# Patient Record
Sex: Female | Born: 1995 | Race: Black or African American | Hispanic: No | Marital: Single | Smoking: Never smoker
Health system: Southern US, Community
[De-identification: ages and names within clinical notes are randomized; demographics above are authoritative.]

## PROBLEM LIST (undated history)

## (undated) HISTORY — PX: BUNIONECTOMY: SHX129

---

## 2016-09-10 ENCOUNTER — Ambulatory Visit (HOSPITAL_COMMUNITY)
Admission: EM | Admit: 2016-09-10 | Discharge: 2016-09-10 | Disposition: A | Discharge status: Home / Self Care | Payer: Medicaid Other | Attending: Internal Medicine | Admitting: Internal Medicine

## 2016-09-10 ENCOUNTER — Encounter (HOSPITAL_COMMUNITY): Payer: Self-pay | Admitting: Emergency Medicine

## 2016-09-10 ENCOUNTER — Ambulatory Visit (INDEPENDENT_AMBULATORY_CARE_PROVIDER_SITE_OTHER): Payer: Medicaid Other

## 2016-09-10 DIAGNOSIS — R69 Illness, unspecified: Secondary | ICD-10-CM

## 2016-09-10 DIAGNOSIS — J40 Bronchitis, not specified as acute or chronic: Secondary | ICD-10-CM | POA: Diagnosis not present

## 2016-09-10 DIAGNOSIS — J111 Influenza due to unidentified influenza virus with other respiratory manifestations: Secondary | ICD-10-CM

## 2016-09-10 MED ORDER — OSELTAMIVIR PHOSPHATE 75 MG PO CAPS: 75.0000 mg | ORAL_CAPSULE | Freq: Two times a day (BID) | ORAL | 0 refills | Status: DC

## 2016-09-10 MED ORDER — ACETAMINOPHEN 325 MG PO TABS
650.0000 mg | ORAL_TABLET | Freq: Once | ORAL | Status: AC
  Administered 2016-09-10: 650 mg via ORAL

## 2016-09-10 MED ORDER — METHYLPREDNISOLONE ACETATE 80 MG/ML IJ SUSP
80.0000 mg | Freq: Once | INTRAMUSCULAR | Status: AC
  Administered 2016-09-10: 80 mg via INTRAMUSCULAR

## 2016-09-10 MED ORDER — BENZONATATE 100 MG PO CAPS: 100.0000 mg | ORAL_CAPSULE | Freq: Three times a day (TID) | ORAL | 0 refills | Status: DC

## 2016-09-10 MED ORDER — ACETAMINOPHEN 325 MG PO TABS
ORAL_TABLET | ORAL | Status: AC
  Filled 2016-09-10: qty 2

## 2016-09-10 MED ORDER — AZITHROMYCIN 250 MG PO TABS: 250.0000 mg | ORAL_TABLET | Freq: Every day | ORAL | 0 refills | Status: DC

## 2016-09-10 MED ORDER — METHYLPREDNISOLONE ACETATE 80 MG/ML IJ SUSP
INTRAMUSCULAR | Status: AC
  Filled 2016-09-10: qty 1

## 2016-09-10 MED ORDER — ALBUTEROL SULFATE HFA 108 (90 BASE) MCG/ACT IN AERS: 1.0000 | INHALATION_SPRAY | Freq: Four times a day (QID) | RESPIRATORY_TRACT | 0 refills | Status: DC | PRN

## 2016-09-10 MED ORDER — PREDNISONE 10 MG (21) PO TBPK: 10.0000 mg | ORAL_TABLET | Freq: Every day | ORAL | 0 refills | Status: DC

## 2016-09-10 MED ORDER — ONDANSETRON 4 MG PO TBDP: 4.0000 mg | ORAL_TABLET | Freq: Three times a day (TID) | ORAL | 0 refills | Status: DC | PRN

## 2016-09-10 NOTE — ED Provider Notes (Signed)
CSN: [id number]     Arrival date & time 09/10/16  1803 History   None    Chief Complaint  Patient presents with  . Cough   (Consider location/radiation/quality/duration/timing/severity/associated sxs/prior Treatment) 21 year old female presents to clinic with 24 hour history of fever muscle aches, body aches and nausea. She also is complaining of cough, ongoing since November, not previously worked up by her provider. Cough is described as dry and hacking, continuous but worse at night. Sputum has been without color. She also reports she has been experiencing shortness of breath, worse with activity that has also been on going for months.   The history is provided by the patient.    History reviewed. No pertinent past medical history. Past Surgical History:  Procedure Laterality Date  . BUNIONECTOMY     No family history on file. Social History  Substance Use Topics  . Smoking status: Never Smoker  . Smokeless tobacco: Never Used  . Alcohol use No   OB History    No data available     Review of Systems  Constitutional: Positive for chills, fatigue and fever.  HENT: Positive for congestion, rhinorrhea and sore throat. Negative for ear discharge, ear pain, sinus pain and sinus pressure.   Eyes: Negative.   Respiratory: Positive for cough, shortness of breath and wheezing. Negative for choking.   Cardiovascular: Negative.   Gastrointestinal: Positive for nausea. Negative for abdominal pain, diarrhea and vomiting.  Musculoskeletal: Positive for myalgias. Negative for arthralgias, back pain, neck pain and neck stiffness.  Skin: Negative.   Neurological: Negative for dizziness, syncope, weakness and light-headedness.  All other systems reviewed and are negative.   Allergies  Patient has no known allergies.  Home Medications   Prior to Admission medications   Medication Sig Start Date End Date Taking? Authorizing Provider  albuterol (PROVENTIL HFA;VENTOLIN HFA) 108 (90  Base) MCG/ACT inhaler Inhale 1-2 puffs into the lungs every 6 (six) hours as needed for wheezing or shortness of breath. 09/10/16   Dorena BodoLawrence Sukhraj Esquivias, NP  azithromycin (ZITHROMAX) 250 MG tablet Take 1 tablet (250 mg total) by mouth daily. Take first 2 tablets together, then 1 every day until finished. 09/10/16   Dorena BodoLawrence Jarris Kortz, NP  benzonatate (TESSALON) 100 MG capsule Take 1 capsule (100 mg total) by mouth every 8 (eight) hours. 09/10/16   Dorena BodoLawrence Joaopedro Eschbach, NP  ondansetron (ZOFRAN ODT) 4 MG disintegrating tablet Take 1 tablet (4 mg total) by mouth every 8 (eight) hours as needed for nausea or vomiting. 09/10/16   Dorena BodoLawrence [place] Lasseter, NP  oseltamivir (TAMIFLU) 75 MG capsule Take 1 capsule (75 mg total) by mouth every 12 (twelve) hours. 09/10/16   Dorena BodoLawrence Anadia Helmes, NP  predniSONE (STERAPRED UNI-PAK 21 TAB) 10 MG (21) TBPK tablet Take 1 tablet (10 mg total) by mouth daily. Take 6 tablets in the morning, decrease by 1 each day till finished 09/10/16   Dorena BodoLawrence Blade Scheff, NP   Meds Ordered and Administered this Visit   Medications  acetaminophen (TYLENOL) tablet 650 mg (650 mg Oral Given 09/10/16 1852)  methylPREDNISolone acetate (DEPO-MEDROL) injection 80 mg (80 mg Intramuscular Given 09/10/16 1930)    BP 130/78 (BP Location: Right Arm)   Pulse (!) 124   Temp 102.9 F (39.4 C) (Oral)   Resp 18   SpO2 98%  No data found.   Physical Exam  Constitutional: She is oriented to person, place, and time. She appears well-developed and well-nourished. She has a sickly appearance. She appears ill. No distress.  HENT:  Head: Normocephalic and atraumatic.  Right Ear: Tympanic membrane and external ear normal.  Left Ear: Tympanic membrane and external ear normal.  Nose: Rhinorrhea present.  Mouth/Throat: Uvula is midline and oropharynx is clear and moist.  Neck: Normal range of motion. Neck supple. No JVD present.  Cardiovascular: Normal rate and regular rhythm.   Pulmonary/Chest: Effort normal and breath sounds  normal.  Abdominal: Soft. Bowel sounds are normal.  Lymphadenopathy:    She has no cervical adenopathy.  Neurological: She is alert and oriented to person, place, and time.  Skin: Skin is warm and dry. Capillary refill takes less than 2 seconds. She is not diaphoretic.  Nursing note and vitals reviewed.   Urgent Care Course     Procedures (including critical care time)  Labs Review Labs Reviewed - No data to display  Imaging Review Dg Chest 2 View  Result Date: 09/10/2016 CLINICAL DATA:  Chronic cough and fever. Sore throat and body aches. Initial encounter. EXAM: CHEST  2 VIEW COMPARISON:  None. FINDINGS: The lungs are well-aerated and clear. There is no evidence of focal opacification, pleural effusion or pneumothorax. The heart is normal in size; the mediastinal contour is within normal limits. No acute osseous abnormalities are seen. IMPRESSION: No acute cardiopulmonary process seen. Electronically Signed   By: Roanna Raider M.D.   On: 09/10/2016 19:39     Visual Acuity Review  Right Eye Distance:   Left Eye Distance:   Bilateral Distance:    Right Eye Near:   Left Eye Near:    Bilateral Near:         MDM   1. Bronchitis   2. Influenza-like illness   I am treating you tonight for two different conditions. I am treating your cough for bronchitis. I have prescribed azithromycin, take 2 tablets tonight, then 1 daily till finished. I have also prescribed a prednisone taper, take 6 tablets in the morning, decrease by 1 each day till finished (6,5,4,3,2,1), and I have prescribed an albuterol inhaler, take 1-2 puffs ever 4-6 hours as needed for shortness of breath or wheezing.  The second condition I am treating you for tonight is influenza. I have prescribed Tamiflu, take 1 tablet twice a day for 5 days. I have given a medicine for nausea, take 1 tablet under the tongue every 8 hours as needed for nausea, and Tessalon Pearls, 1 tablet every 8 hours as needed for  cough.  For symptoms, take tylenol ever 4 hours as needed for fever, not to exceed 4000 mg a day, or ibuprofen every 6 hours, not to exceed 1600 mg in a day. You may do mucinex twice a day with a full glass of water and for congestion a pseudoephedrine containing product. Should your symptoms fail to resolve or worsen follow up with your primary care provider or return to clinic.      Dorena Bodo, NP 09/10/16 1956

## 2016-09-10 NOTE — ED Triage Notes (Signed)
Onset Monday of symptoms, frequently coughing, unknown if she had a fever.  Patient feels weak and very tired

## 2016-09-10 NOTE — Discharge Instructions (Signed)
I am treating you tonight for two different conditions. I am treating your cough for bronchitis. I have prescribed azithromycin, take 2 tablets tonight, then 1 daily till finished. I have also prescribed a prednisone taper, take 6 tablets in the morning, decrease by 1 each day till finished (6,5,4,3,2,1), and I have prescribed an albuterol inhaler, take 1-2 puffs ever 4-6 hours as needed for shortness of breath or wheezing.  The second condition I am treating you for tonight is influenza. I have prescribed Tamiflu, take 1 tablet twice a day for 5 days. I have given a medicine for nausea, take 1 tablet under the tongue every 8 hours as needed for nausea, and Tessalon Pearls, 1 tablet every 8 hours as needed for cough.  For symptoms, take tylenol ever 4 hours as needed for fever, not to exceed 4000 mg a day, or ibuprofen every 6 hours, not to exceed 1600 mg in a day. You may do mucinex twice a day with a full glass of water and for congestion a pseudoephedrine containing product. Should your symptoms fail to resolve or worsen follow up with your primary care provider or return to clinic.

## 2016-09-29 ENCOUNTER — Encounter (HOSPITAL_COMMUNITY): Payer: Self-pay | Admitting: Emergency Medicine

## 2016-09-29 ENCOUNTER — Ambulatory Visit (HOSPITAL_COMMUNITY)
Admission: EM | Admit: 2016-09-29 | Discharge: 2016-09-29 | Disposition: A | Discharge status: Home / Self Care | Payer: Medicaid Other | Attending: Family Medicine | Admitting: Family Medicine

## 2016-09-29 DIAGNOSIS — B9789 Other viral agents as the cause of diseases classified elsewhere: Secondary | ICD-10-CM | POA: Diagnosis not present

## 2016-09-29 DIAGNOSIS — J069 Acute upper respiratory infection, unspecified: Secondary | ICD-10-CM | POA: Diagnosis not present

## 2016-09-29 MED ORDER — GUAIFENESIN-CODEINE 100-10 MG/5ML PO SYRP: 10.0000 mL | ORAL_SOLUTION | Freq: Four times a day (QID) | ORAL | 0 refills | Status: DC | PRN

## 2016-09-29 MED ORDER — AZITHROMYCIN 250 MG PO TABS: ORAL_TABLET | ORAL | 0 refills | Status: DC

## 2016-09-29 MED ORDER — IPRATROPIUM BROMIDE 0.06 % NA SOLN: 2.0000 | Freq: Four times a day (QID) | NASAL | 1 refills | Status: DC

## 2016-09-29 NOTE — ED Triage Notes (Signed)
Pt reports cough, nasal congestion, head pressure, dizziness and chills since Friday.

## 2016-09-29 NOTE — ED Provider Notes (Signed)
[facility]    CSN: [id number] Arrival date & time: 09/29/16  1021     History   Chief Complaint Chief Complaint  Patient presents with  . URI    HPI Veronica Powell is a 21 y.o. female.   The history is provided by the patient.  URI  Presenting symptoms: congestion, cough, rhinorrhea and sore throat   Presenting symptoms: no fever   Severity:  Moderate Onset quality:  Gradual (seen 1/24 fdr similar sx, improved briefly then relapsed1 wk.) Duration:  1 week Progression:  Worsening Chronicity:  Recurrent Relieved by:  Nothing Associated symptoms: no wheezing     History reviewed. No pertinent past medical history.  There are no active problems to display for this patient.   Past Surgical History:  Procedure Laterality Date  . BUNIONECTOMY      OB History    No data available       Home Medications    Prior to Admission medications   Medication Sig Start Date End Date Taking? Authorizing Provider  albuterol (PROVENTIL HFA;VENTOLIN HFA) 108 (90 Base) MCG/ACT inhaler Inhale 1-2 puffs into the lungs every 6 (six) hours as needed for wheezing or shortness of breath. 09/10/16  Yes Dorena BodoLawrence Kennard, NP  azithromycin (ZITHROMAX) 250 MG tablet Take 1 tablet (250 mg total) by mouth daily. Take first 2 tablets together, then 1 every day until finished. 09/10/16   Dorena BodoLawrence Kennard, NP  benzonatate (TESSALON) 100 MG capsule Take 1 capsule (100 mg total) by mouth every 8 (eight) hours. 09/10/16   Dorena BodoLawrence Kennard, NP  ondansetron (ZOFRAN ODT) 4 MG disintegrating tablet Take 1 tablet (4 mg total) by mouth every 8 (eight) hours as needed for nausea or vomiting. 09/10/16   Dorena BodoLawrence Kennard, NP  oseltamivir (TAMIFLU) 75 MG capsule Take 1 capsule (75 mg total) by mouth every 12 (twelve) hours. 09/10/16   Dorena BodoLawrence Kennard, NP  predniSONE (STERAPRED UNI-PAK 21 TAB) 10 MG (21) TBPK tablet Take 1 tablet (10 mg total) by mouth daily. Take 6 tablets in the morning, decrease by 1  each day till finished 09/10/16   Dorena BodoLawrence Kennard, NP    Family History History reviewed. No pertinent family history.  Social History Social History  Substance Use Topics  . Smoking status: Never Smoker  . Smokeless tobacco: Never Used  . Alcohol use No     Allergies   Patient has no known allergies.   Review of Systems Review of Systems  Constitutional: Negative.  Negative for activity change, appetite change and fever.  HENT: Positive for congestion, rhinorrhea and sore throat.   Respiratory: Positive for cough. Negative for shortness of breath and wheezing.   All other systems reviewed and are negative.    Physical Exam Triage Vital Signs ED Triage Vitals [09/29/16 1128]  Enc Vitals Group     BP 111/61     Pulse Rate 100     Resp      Temp 99.1 F (37.3 C)     Temp Source Oral     SpO2 100 %     Weight      Height      Head Circumference      Peak Flow      Pain Score 8     Pain Loc      Pain Edu?      Excl. in GC?    No data found.   Updated Vital Signs BP 111/61 (BP Location: Right Arm)   Pulse 100  Temp 99.1 F (37.3 C) (Oral)   SpO2 100%   Visual Acuity Right Eye Distance:   Left Eye Distance:   Bilateral Distance:    Right Eye Near:   Left Eye Near:    Bilateral Near:     Physical Exam  Constitutional: She appears well-developed and well-nourished.  HENT:  Right Ear: External ear normal.  Left Ear: External ear normal.  Nose: Rhinorrhea present.  Mouth/Throat: Oropharynx is clear and moist.  Eyes: EOM are normal. Pupils are equal, round, and reactive to light.  Neck: Normal range of motion. Neck supple.  Cardiovascular: Normal rate, regular rhythm, normal heart sounds and intact distal pulses.   Pulmonary/Chest: Effort normal and breath sounds normal.  Abdominal: Soft. Bowel sounds are normal.  Lymphadenopathy:    She has no cervical adenopathy.  Skin: Skin is warm and dry.  Nursing note and vitals reviewed.    UC  Treatments / Results  Labs (all labs ordered are listed, but only abnormal results are displayed) Labs Reviewed - No data to display  EKG  EKG Interpretation None       Radiology No results found.  Procedures Procedures (including critical care time)  Medications Ordered in UC Medications - No data to display   Initial Impression / Assessment and Plan / UC Course  I have reviewed the triage vital signs and the nursing notes.  Pertinent labs & imaging results that were available during my care of the patient were reviewed by me and considered in my medical decision making (see chart for details).       Final Clinical Impressions(s) / UC Diagnoses   Final diagnoses:  None    New Prescriptions New Prescriptions   No medications on file     Linna Hoff, MD 09/29/16 1228

## 2017-04-06 ENCOUNTER — Ambulatory Visit (HOSPITAL_COMMUNITY)
Admission: EM | Admit: 2017-04-06 | Discharge: 2017-04-06 | Disposition: A | Discharge status: Home / Self Care | Payer: Medicaid Other | Attending: Family Medicine | Admitting: Family Medicine

## 2017-04-06 ENCOUNTER — Encounter (HOSPITAL_COMMUNITY): Payer: Self-pay | Admitting: Emergency Medicine

## 2017-04-06 DIAGNOSIS — B9789 Other viral agents as the cause of diseases classified elsewhere: Secondary | ICD-10-CM | POA: Diagnosis not present

## 2017-04-06 DIAGNOSIS — J069 Acute upper respiratory infection, unspecified: Secondary | ICD-10-CM

## 2017-04-06 MED ORDER — IPRATROPIUM BROMIDE 0.06 % NA SOLN: 2.0000 | Freq: Four times a day (QID) | NASAL | 12 refills | Status: AC

## 2017-04-06 MED ORDER — BENZONATATE 100 MG PO CAPS: 100.0000 mg | ORAL_CAPSULE | Freq: Three times a day (TID) | ORAL | 0 refills | Status: DC

## 2017-04-06 MED ORDER — CETIRIZINE-PSEUDOEPHEDRINE ER 5-120 MG PO TB12: 1.0000 | ORAL_TABLET | Freq: Every day | ORAL | 0 refills | Status: AC

## 2017-04-06 NOTE — ED Provider Notes (Signed)
[facility]    CSN: [id number] Arrival date & time: 04/06/17  1416     History   Chief Complaint Chief Complaint  Patient presents with  . URI    HPI Veronica Powell is a 21 y.o. female.   21 year old female comes in for 3-4 day history of nasal congestion, cough, throat irritation, ear fullness. She has been using Flonase and over-the-counter cough medicine with some relief. Had chills last night. Denies fever, night sweats. States has trouble breathing, shortness of breath, but could be due to stuffy nose. Denies abdominal pain, nausea, vomiting, diarrhea, constipation. Denies wheezing, history of asthma.       History reviewed. No pertinent past medical history.  There are no active problems to display for this patient.   Past Surgical History:  Procedure Laterality Date  . BUNIONECTOMY      OB History    No data available       Home Medications    Prior to Admission medications   Medication Sig Start Date End Date Taking? Authorizing Provider  benzonatate (TESSALON) 100 MG capsule Take 1 capsule (100 mg total) by mouth every 8 (eight) hours. 04/06/17   Cathie Hoops, Shanna Un V, PA-C  cetirizine-pseudoephedrine (ZYRTEC-D) 5-120 MG tablet Take 1 tablet by mouth daily. 04/06/17   Cathie Hoops, Avelino Herren V, PA-C  ipratropium (ATROVENT) 0.06 % nasal spray Place 2 sprays into both nostrils 4 (four) times daily. 04/06/17   Belinda Fisher, PA-C    Family History History reviewed. No pertinent family history.  Social History Social History  Substance Use Topics  . Smoking status: Never Smoker  . Smokeless tobacco: Never Used  . Alcohol use No     Allergies   Patient has no known allergies.   Review of Systems Review of Systems  Reason unable to perform ROS: See HPI as above.     Physical Exam Triage Vital Signs ED Triage Vitals [04/06/17 1443]  Enc Vitals Group     BP 98/70     Pulse Rate 93     Resp      Temp 98.5 F (36.9 C)     Temp Source Oral     SpO2 99 %   Weight      Height      Head Circumference      Peak Flow      Pain Score      Pain Loc      Pain Edu?      Excl. in GC?    No data found.   Updated Vital Signs BP 98/70 (BP Location: Left Arm)   Pulse 93   Temp 98.5 F (36.9 C) (Oral)   SpO2 99%   Visual Acuity Right Eye Distance:   Left Eye Distance:   Bilateral Distance:    Right Eye Near:   Left Eye Near:    Bilateral Near:     Physical Exam  Constitutional: She is oriented to person, place, and time. She appears well-developed and well-nourished. No distress.  HENT:  Head: Normocephalic and atraumatic.  Right Ear: External ear and ear canal normal. Tympanic membrane is erythematous. Tympanic membrane is not bulging. A middle ear effusion is present.  Left Ear: External ear and ear canal normal. Tympanic membrane is not erythematous and not bulging. A middle ear effusion is present.  Nose: Mucosal edema and rhinorrhea present. Right sinus exhibits no maxillary sinus tenderness and no frontal sinus tenderness. Left sinus exhibits no maxillary sinus tenderness and no  frontal sinus tenderness.  Mouth/Throat: Uvula is midline and mucous membranes are normal. Posterior oropharyngeal erythema present. No posterior oropharyngeal edema.  Eyes: Pupils are equal, round, and reactive to light. Conjunctivae are normal.  Neck: Normal range of motion. Neck supple.  Cardiovascular: Normal rate, regular rhythm and normal heart sounds.  Exam reveals no gallop and no friction rub.   No murmur heard. Pulmonary/Chest: Effort normal and breath sounds normal. She has no decreased breath sounds. She has no wheezes. She has no rhonchi. She has no rales.  Lymphadenopathy:    She has no cervical adenopathy.  Neurological: She is alert and oriented to person, place, and time.  Skin: Skin is warm and dry.  Psychiatric: She has a normal mood and affect. Her behavior is normal. Judgment normal.     UC Treatments / Results  Labs (all labs  ordered are listed, but only abnormal results are displayed) Labs Reviewed - No data to display  EKG  EKG Interpretation None       Radiology No results found.  Procedures Procedures (including critical care time)  Medications Ordered in UC Medications - No data to display   Initial Impression / Assessment and Plan / UC Course  I have reviewed the triage vital signs and the nursing notes.  Pertinent labs & imaging results that were available during my care of the patient were reviewed by me and considered in my medical decision making (see chart for details).    Discussed with patient history and exam most consistent with viral URI. Erythema on the tympanic membrane most likely due to mid ear effusion and coughing. Symptomatic treatment. Push fluids. Return precautions given.  Final Clinical Impressions(s) / UC Diagnoses   Final diagnoses:  Viral URI with cough    New Prescriptions New Prescriptions   BENZONATATE (TESSALON) 100 MG CAPSULE    Take 1 capsule (100 mg total) by mouth every 8 (eight) hours.   CETIRIZINE-PSEUDOEPHEDRINE (ZYRTEC-D) 5-120 MG TABLET    Take 1 tablet by mouth daily.   IPRATROPIUM (ATROVENT) 0.06 % NASAL SPRAY    Place 2 sprays into both nostrils 4 (four) times daily.     Belinda Fisher, PA-C 04/06/17 1523

## 2017-04-06 NOTE — ED Triage Notes (Signed)
Pt complains of nasal congestion and drainage, a scratchy throat, cough and fullness in her head and ears.

## 2017-04-06 NOTE — Discharge Instructions (Signed)
Tessalon for cough. Zyrtec-D and Atrovent nasal spray for nasal congestion. You can also use over-the-counter nasal rinse such as neti pot for nasal congestion. Keep hydrated, your urine should be clear to pale yellow in color. Monitor for worsening of symptoms, right ear pain not resolving, fever, shortness of breath, trouble breathing, follow-up for reevaluation.

## 2017-05-20 ENCOUNTER — Encounter (HOSPITAL_COMMUNITY): Payer: Self-pay | Admitting: Emergency Medicine

## 2017-05-20 ENCOUNTER — Emergency Department (HOSPITAL_COMMUNITY): Payer: Medicaid Other

## 2017-05-20 DIAGNOSIS — R0602 Shortness of breath: Secondary | ICD-10-CM | POA: Insufficient documentation

## 2017-05-20 DIAGNOSIS — Z79899 Other long term (current) drug therapy: Secondary | ICD-10-CM | POA: Diagnosis not present

## 2017-05-20 DIAGNOSIS — J208 Acute bronchitis due to other specified organisms: Secondary | ICD-10-CM | POA: Insufficient documentation

## 2017-05-20 DIAGNOSIS — R05 Cough: Secondary | ICD-10-CM | POA: Diagnosis present

## 2017-05-20 LAB — CBC WITH DIFFERENTIAL/PLATELET
BASOS ABS: 0 10*3/uL (ref 0.0–0.1)
BASOS PCT: 0 %
EOS ABS: 0.1 10*3/uL (ref 0.0–0.7)
Eosinophils Relative: 1 %
HCT: 34.3 % — ABNORMAL LOW (ref 36.0–46.0)
HEMOGLOBIN: 11.3 g/dL — AB (ref 12.0–15.0)
Lymphocytes Relative: 33 %
Lymphs Abs: 2.2 10*3/uL (ref 0.7–4.0)
MCH: 27.7 pg (ref 26.0–34.0)
MCHC: 32.9 g/dL (ref 30.0–36.0)
MCV: 84.1 fL (ref 78.0–100.0)
MONOS PCT: 5 %
Monocytes Absolute: 0.3 10*3/uL (ref 0.1–1.0)
NEUTROS ABS: 4 10*3/uL (ref 1.7–7.7)
NEUTROS PCT: 61 %
Platelets: 237 10*3/uL (ref 150–400)
RBC: 4.08 MIL/uL (ref 3.87–5.11)
RDW: 13.5 % (ref 11.5–15.5)
WBC: 6.6 10*3/uL (ref 4.0–10.5)

## 2017-05-20 LAB — I-STAT BETA HCG BLOOD, ED (MC, WL, AP ONLY)

## 2017-05-20 LAB — BASIC METABOLIC PANEL
ANION GAP: 8 (ref 5–15)
BUN: 8 mg/dL (ref 6–20)
CHLORIDE: 103 mmol/L (ref 101–111)
CO2: 25 mmol/L (ref 22–32)
Calcium: 9 mg/dL (ref 8.9–10.3)
Creatinine, Ser: 0.71 mg/dL (ref 0.44–1.00)
GFR calc non Af Amer: >60 mL/min (ref >60)
Glucose, Bld: 84 mg/dL (ref 65–99)
POTASSIUM: 3.9 mmol/L (ref 3.5–5.1)
SODIUM: 136 mmol/L (ref 135–145)

## 2017-05-20 NOTE — ED Triage Notes (Signed)
Patient reports persistent dry cough with mild SOB for several weeks , denies fever or chills .

## 2017-05-21 ENCOUNTER — Emergency Department (HOSPITAL_COMMUNITY)
Admission: EM | Admit: 2017-05-21 | Discharge: 2017-05-21 | Disposition: A | Discharge status: Home / Self Care | Payer: Medicaid Other | Attending: Emergency Medicine | Admitting: Emergency Medicine

## 2017-05-21 DIAGNOSIS — J208 Acute bronchitis due to other specified organisms: Secondary | ICD-10-CM

## 2017-05-21 MED ORDER — ALBUTEROL SULFATE HFA 108 (90 BASE) MCG/ACT IN AERS: 1.0000 | INHALATION_SPRAY | Freq: Four times a day (QID) | RESPIRATORY_TRACT | 0 refills | Status: AC | PRN

## 2017-05-21 MED ORDER — ALBUTEROL SULFATE HFA 108 (90 BASE) MCG/ACT IN AERS
2.0000 | INHALATION_SPRAY | Freq: Once | RESPIRATORY_TRACT | Status: AC
  Administered 2017-05-21: 2 via RESPIRATORY_TRACT

## 2017-05-21 MED ORDER — ALBUTEROL SULFATE HFA 108 (90 BASE) MCG/ACT IN AERS
INHALATION_SPRAY | RESPIRATORY_TRACT | Status: AC
  Filled 2017-05-21: qty 6.7

## 2017-05-21 MED ORDER — DEXAMETHASONE 4 MG PO TABS
8.0000 mg | ORAL_TABLET | Freq: Once | ORAL | Status: AC
  Administered 2017-05-21: 8 mg via ORAL

## 2017-05-21 MED ORDER — DEXAMETHASONE 4 MG PO TABS
ORAL_TABLET | ORAL | Status: AC
  Filled 2017-05-21: qty 2

## 2017-05-21 MED ORDER — AMOXICILLIN 500 MG PO CAPS: 500.0000 mg | ORAL_CAPSULE | Freq: Three times a day (TID) | ORAL | 0 refills | Status: AC

## 2017-05-21 NOTE — ED Provider Notes (Signed)
[facility]   CSN: [id number] Arrival date & time: 05/20/17  2055     History   Chief Complaint Chief Complaint  Patient presents with  . Cough    HPI Veronica Powell is a 21 y.o. female.  The history is provided by the patient.  Cough  This is a new problem. The current episode started more than 1 week ago. The problem occurs every few minutes. The problem has been gradually worsening. The cough is non-productive. There has been no fever. Associated symptoms include chest pain, chills and shortness of breath. She is not a smoker.  Pt is without chronic medical conditions presents with cough for over a month No hemoptysis She has chest wall pain due to cough She feels short of breath due to cough She was seen at urgent care in August for same cough but it is persistent No h/o asthma   PMH - none Soc hx - nonsmoker, no travel Past Surgical History:  Procedure Laterality Date  . BUNIONECTOMY      OB History    No data available       Home Medications    Prior to Admission medications   Medication Sig Start Date End Date Taking? Authorizing Provider  albuterol (PROVENTIL HFA;VENTOLIN HFA) 108 (90 Base) MCG/ACT inhaler Inhale 1-2 puffs into the lungs every 6 (six) hours as needed for wheezing or shortness of breath. 05/21/17   Zadie Rhine, MD  amoxicillin (AMOXIL) 500 MG capsule Take 1 capsule (500 mg total) by mouth 3 (three) times daily. 05/21/17   Zadie Rhine, MD  cetirizine-pseudoephedrine (ZYRTEC-D) 5-120 MG tablet Take 1 tablet by mouth daily. 04/06/17   Cathie Hoops, Amy V, PA-C  ipratropium (ATROVENT) 0.06 % nasal spray Place 2 sprays into both nostrils 4 (four) times daily. 04/06/17   Belinda Fisher, PA-C    Family History No family history on file.  Social History Social History  Substance Use Topics  . Smoking status: Never Smoker  . Smokeless tobacco: Never Used  . Alcohol use No     Allergies   Patient has no known  allergies.   Review of Systems Review of Systems  Constitutional: Positive for chills.  Respiratory: Positive for cough and shortness of breath.   Cardiovascular: Positive for chest pain.  Gastrointestinal: Negative for vomiting.  All other systems reviewed and are negative.    Physical Exam Updated Vital Signs BP 120/66 (BP Location: Right Arm)   Pulse 78   Temp 98.3 F (36.8 C) (Oral)   Resp 16   SpO2 100%   Physical Exam CONSTITUTIONAL: Well developed/well nourished HEAD: Normocephalic/atraumatic EYES: EOMI/PERRL ENMT: Mucous membranes moist, uvula midline, no erythema/exudates NECK: supple no meningeal signs SPINE/BACK:entire spine nontender CV: S1/S2 noted, no murmurs/rubs/gallops noted LUNGS: decreased BS noted in the bases.  Scattered wheeze.  No crackles.  Coughs frequently during exam ABDOMEN: soft, nontender  GU:no cva tenderness NEURO: Pt is awake/alert/appropriate, moves all extremitiesx4.  No facial droop.   EXTREMITIES: pulses normal/equal, full ROM, no LE edema, no  Calf tenderness SKIN: warm, color normal PSYCH: no abnormalities of mood noted, alert and oriented to situation   ED Treatments / Results  Labs (all labs ordered are listed, but only abnormal results are displayed) Labs Reviewed  CBC WITH DIFFERENTIAL/PLATELET - Abnormal; Notable for the following:       Result Value   Hemoglobin 11.3 (*)    HCT 34.3 (*)    All other components within normal limits  BASIC METABOLIC PANEL  I-STAT BETA HCG BLOOD, ED ([facility], [facility], [facility] ONLY)    EKG  EKG Interpretation  Date/Time:  Wednesday May 20 2017 20:59:10 EDT Ventricular Rate:  91 PR Interval:  144 QRS Duration: 70 QT Interval:  330 QTC Calculation: 405 R Axis:   70 Text Interpretation:  Normal sinus rhythm Normal ECG No previous ECGs available Confirmed by Zadie Rhine ([id number]) on 05/21/2017 1:51:50 AM       Radiology Dg Chest 2 View  Result Date: 05/20/2017 CLINICAL DATA:  Dry cough  and shortness-of-breath 2 weeks. EXAM: CHEST  2 VIEW COMPARISON:  09/10/2016 FINDINGS: Lungs are adequately inflated without focal airspace consolidation or effusion. Cardiomediastinal silhouette is within normal. Bones and soft tissues are unremarkable. IMPRESSION: No active cardiopulmonary disease. Electronically Signed   By: Elberta Fortis M.D.   On: 05/20/2017 21:51    Procedures Procedures (including critical care time)  Medications Ordered in ED Medications  albuterol (PROVENTIL HFA;VENTOLIN HFA) 108 (90 Base) MCG/ACT inhaler 2 puff (2 puffs Inhalation Given 05/21/17 0214)  dexamethasone (DECADRON) tablet 8 mg (8 mg Oral Given 05/21/17 0214)     Initial Impression / Assessment and Plan / ED Course  I have reviewed the triage vital signs and the nursing notes.  Pertinent labs & imaging results that were available during my care of the patient were reviewed by me and considered in my medical decision making (see chart for details).     Pt stable No distress, smiling She has continued cough for a month Due to persistent cough will give antibiotic She did have scattered wheeze, will treat with albuterol/decadron We discussed strict ER return precautions   Final Clinical Impressions(s) / ED Diagnoses   Final diagnoses:  Acute bronchitis due to other specified organisms    New Prescriptions New Prescriptions   ALBUTEROL (PROVENTIL HFA;VENTOLIN HFA) 108 (90 BASE) MCG/ACT INHALER    Inhale 1-2 puffs into the lungs every 6 (six) hours as needed for wheezing or shortness of breath.   AMOXICILLIN (AMOXIL) 500 MG CAPSULE    Take 1 capsule (500 mg total) by mouth 3 (three) times daily.     Zadie Rhine, MD 05/21/17 Emeline Darling

## 2017-10-01 ENCOUNTER — Encounter (HOSPITAL_COMMUNITY): Payer: Self-pay | Admitting: Emergency Medicine

## 2017-10-01 ENCOUNTER — Ambulatory Visit (HOSPITAL_COMMUNITY)
Admission: EM | Admit: 2017-10-01 | Discharge: 2017-10-01 | Disposition: A | Discharge status: Home / Self Care | Payer: Medicaid Other | Attending: Internal Medicine | Admitting: Internal Medicine

## 2017-10-01 ENCOUNTER — Other Ambulatory Visit: Payer: Self-pay

## 2017-10-01 DIAGNOSIS — K5900 Constipation, unspecified: Secondary | ICD-10-CM | POA: Diagnosis not present

## 2017-10-01 NOTE — Discharge Instructions (Signed)
Please use Miralax for moderate to severe constipation. Take this once a day for the next 2-3 days. Please also start docusate stool softener, twice a day for at least 1 week. If stools become loose, cut down to once a day for another week. If stools remain loose, cut back to 1 pill every other day for a third week. You can stop docusate thereafter and resume as needed for constipation. You may titrate the amount of Miralax and frequency of docusate to have more regular bowels.   If you wish to do a clean out try taking and capful of Miralax up to three times spaced 1 hour apart. If bowels beginning to move after first or second capful you do not need to repeat the third time.   To help reduce constipation and promote bowel health: 1. Drink at least 64 ounces of water each day 2. Eat plenty of fiber (fruits, vegetables, whole grains, legumes) 3. Be physically active or exercise including walking, jogging, swimming, yoga, etc. 4. For active constipation use a stool softener (docusate) or an osmotic laxative (like Miralax) each day, or as needed.

## 2017-10-01 NOTE — ED Triage Notes (Signed)
Pt c/o generalized abdominal pain x3 days. Denies n/v/d. Comes and goes. Denies issues with urination. Nontender to palpation.

## 2017-10-02 NOTE — ED Provider Notes (Signed)
[facility]    CSN: [id number] Arrival date & time: 10/01/17  1257     History   Chief Complaint Chief Complaint  Patient presents with  . Abdominal Pain  . Constipation    HPI Veronica Powell is a 22 y.o. female history of lactose intolerance presenting today with intermittent abdominal pain and states that she struggles with constipation.  She has not had a bowel movement in 5 days.  She has taken 1 capful of MiraLAX for the past 2 days.  Abdominal pain has come on for approximately the past 3 days.  She tries to avoid dairy products.  HPI  History reviewed. No pertinent past medical history.  There are no active problems to display for this patient.   Past Surgical History:  Procedure Laterality Date  . BUNIONECTOMY      OB History    No data available       Home Medications    Prior to Admission medications   Medication Sig Start Date End Date Taking? Authorizing Provider  albuterol (PROVENTIL HFA;VENTOLIN HFA) 108 (90 Base) MCG/ACT inhaler Inhale 1-2 puffs into the lungs every 6 (six) hours as needed for wheezing or shortness of breath. 05/21/17   Zadie Rhine, MD  amoxicillin (AMOXIL) 500 MG capsule Take 1 capsule (500 mg total) by mouth 3 (three) times daily. Patient not taking: Reported on 10/01/2017 05/21/17   Zadie Rhine, MD  cetirizine-pseudoephedrine (ZYRTEC-D) 5-120 MG tablet Take 1 tablet by mouth daily. Patient not taking: Reported on 10/01/2017 04/06/17   Belinda Fisher, PA-C  ipratropium (ATROVENT) 0.06 % nasal spray Place 2 sprays into both nostrils 4 (four) times daily. Patient not taking: Reported on 10/01/2017 04/06/17   Lurline Idol    Family History No family history on file.  Social History Social History   Tobacco Use  . Smoking status: Never Smoker  . Smokeless tobacco: Never Used  Substance Use Topics  . Alcohol use: No  . Drug use: No     Allergies   Lactose intolerance (gi)   Review of Systems Review of  Systems  Constitutional: Negative for activity change, appetite change, fatigue and fever.  Respiratory: Negative for shortness of breath.   Cardiovascular: Negative for chest pain.  Gastrointestinal: Positive for abdominal pain and constipation. Negative for blood in stool, diarrhea, nausea and vomiting.  Genitourinary: Negative for dysuria, frequency and vaginal discharge.  Neurological: Negative for dizziness, weakness and light-headedness.     Physical Exam Triage Vital Signs ED Triage Vitals  Enc Vitals Group     BP 10/01/17 1427 121/67     Pulse Rate 10/01/17 1427 85     Resp 10/01/17 1427 18     Temp 10/01/17 1427 98.8 F (37.1 C)     Temp src --      SpO2 10/01/17 1427 100 %     Weight --      Height --      Head Circumference --      Peak Flow --      Pain Score 10/01/17 1428 5     Pain Loc --      Pain Edu? --      Excl. in GC? --    No data found.  Updated Vital Signs BP 121/67   Pulse 85   Temp 98.8 F (37.1 C)   Resp 18   SpO2 100%   Visual Acuity Right Eye Distance:   Left Eye Distance:   Bilateral  Distance:    Right Eye Near:   Left Eye Near:    Bilateral Near:     Physical Exam  Constitutional: She appears well-developed and well-nourished. No distress.  HENT:  Head: Normocephalic and atraumatic.  Mouth/Throat: Oropharynx is clear and moist.  Eyes: Conjunctivae are normal. Pupils are equal, round, and reactive to light.  Neck: Neck supple.  Cardiovascular: Normal rate and regular rhythm.  No murmur heard. Pulmonary/Chest: Effort normal and breath sounds normal. No respiratory distress.  Abdominal: Soft. There is no tenderness.  Abdomen slightly firm, dull to percussion throughout, nontender to light and deep palpation in all 4 quadrants.  Musculoskeletal: She exhibits no edema.  Neurological: She is alert.  Skin: Skin is warm and dry.  Psychiatric: She has a normal mood and affect.  Nursing note and vitals reviewed.    UC Treatments  / Results  Labs (all labs ordered are listed, but only abnormal results are displayed) Labs Reviewed - No data to display  EKG  EKG Interpretation None       Radiology No results found.  Procedures Procedures (including critical care time)  Medications Ordered in UC Medications - No data to display   Initial Impression / Assessment and Plan / UC Course  I have reviewed the triage vital signs and the nursing notes.  Pertinent labs & imaging results that were available during my care of the patient were reviewed by me and considered in my medical decision making (see chart for details).     Patient with constipation, recommended to do either a cleanout using up to 3 capfuls of MiraLAX space out of her 1 hour each.  Or to add an docusate 8 twice daily with daily MiraLAX.  Titrate docusate and MiraLAX to regular bowels/consistency.  Advised to daily regimen is too much to take MiraLAX after she has not had a bowel movement in 2 days instead of waiting until 5.  Discussed other general lifestyle modifications to help with constipation.  Discussed strict return precautions. Patient verbalized understanding and is agreeable with plan.   Final Clinical Impressions(s) / UC Diagnoses   Final diagnoses:  Constipation, unspecified constipation type    ED Discharge Orders    None       Controlled Substance Prescriptions [place] Controlled Substance Registry consulted? Not Applicable   Lew DawesWieters, Gail Vendetti C, [place]PA-C 10/02/17 [phone]

## 2017-11-11 ENCOUNTER — Other Ambulatory Visit: Payer: Self-pay

## 2017-11-11 ENCOUNTER — Ambulatory Visit (HOSPITAL_COMMUNITY)
Admission: EM | Admit: 2017-11-11 | Discharge: 2017-11-11 | Disposition: A | Discharge status: Home / Self Care | Payer: Medicaid Other | Attending: Family Medicine | Admitting: Family Medicine

## 2017-11-11 ENCOUNTER — Encounter (HOSPITAL_COMMUNITY): Payer: Self-pay | Admitting: Emergency Medicine

## 2017-11-11 DIAGNOSIS — R195 Other fecal abnormalities: Secondary | ICD-10-CM

## 2017-11-11 NOTE — ED Triage Notes (Signed)
Patient reports taking a lot of miralax this morning.  Patient then went to work.  Patient reports frequent soft/watery stools.  Employer wants patient to be evaluated and wants a note saying she is ok to work

## 2017-11-18 NOTE — ED Provider Notes (Signed)
[facility]   [id number] 11/11/17 Arrival Time: 1731  ASSESSMENT & PLAN:  1. Loose stools    Symptoms improving rapidly. Note for work return given.  Reviewed expectations re: course of current medical issues. Questions answered. Outlined signs and symptoms indicating need for more acute intervention. Patient verbalized understanding. After Visit Summary given.   SUBJECTIVE: History from: patient.  Veronica Powell is a 22 y.o. female who reports taking Miralax yesterday and today. Frequent bowel movements that are improving. But symptoms caused her to miss work. No fever or abdominal pain. Normal PO intake without n/v. "Feel fine now. Just need a work note."  No LMP recorded. Patient has had an implant.  Past Surgical History:  Procedure Laterality Date  . BUNIONECTOMY      ROS: As per HPI.  OBJECTIVE:  Vitals:   11/11/17 1851  BP: 115/64  Pulse: 85  Resp: 18  Temp: 98.8 F (37.1 C)  TempSrc: Oral  SpO2: 100%    General appearance: alert; no distress Oropharynx: moist Lungs: clear to auscultation bilaterally Heart: regular rate and rhythm Abdomen: soft; non-distended; no significant abdominal tenderness,bowel sounds present Back: no CVA tenderness Extremities: no edema Skin: warm and dry Psychological: alert and cooperative; normal mood and affect   Allergies  Allergen Reactions  . Lactose Intolerance (Gi)                                                History reviewed. No pertinent past medical history. Social History   Socioeconomic History  . Marital status: Single    Spouse name: Not on file  . Number of children: Not on file  . Years of education: Not on file  . Highest education level: Not on file  Occupational History  . Not on file  Social Needs  . Financial resource strain: Not on file  . Food insecurity:    Worry: Not on file    Inability: Not on file  . Transportation needs:    Medical: Not on file    Non-medical: Not  on file  Tobacco Use  . Smoking status: Never Smoker  . Smokeless tobacco: Never Used  Substance and Sexual Activity  . Alcohol use: No  . Drug use: No  . Sexual activity: Not on file  Lifestyle  . Physical activity:    Days per week: Not on file    Minutes per session: Not on file  . Stress: Not on file  Relationships  . Social connections:    Talks on phone: Not on file    Gets together: Not on file    Attends religious service: Not on file    Active member of club or organization: Not on file    Attends meetings of clubs or organizations: Not on file    Relationship status: Not on file  . Intimate partner violence:    Fear of current or ex partner: Not on file    Emotionally abused: Not on file    Physically abused: Not on file    Forced sexual activity: Not on file  Other Topics Concern  . Not on file  Social History Narrative  . Not on file   Family History  Problem Relation Age of Onset  . Healthy Mother   . Healthy Father      Mardella LaymanHagler, Annasophia Crocker, MD 11/18/17 [phone]

## 2018-03-23 IMAGING — DX DG CHEST 2V
2 series · 2 of 2 positions shown · non-contrast
Comparison: 09/10/2016

CLINICAL DATA: Dry cough and shortness-of-breath 2 weeks.

EXAM:
CHEST  2 VIEW

[w chest lat]
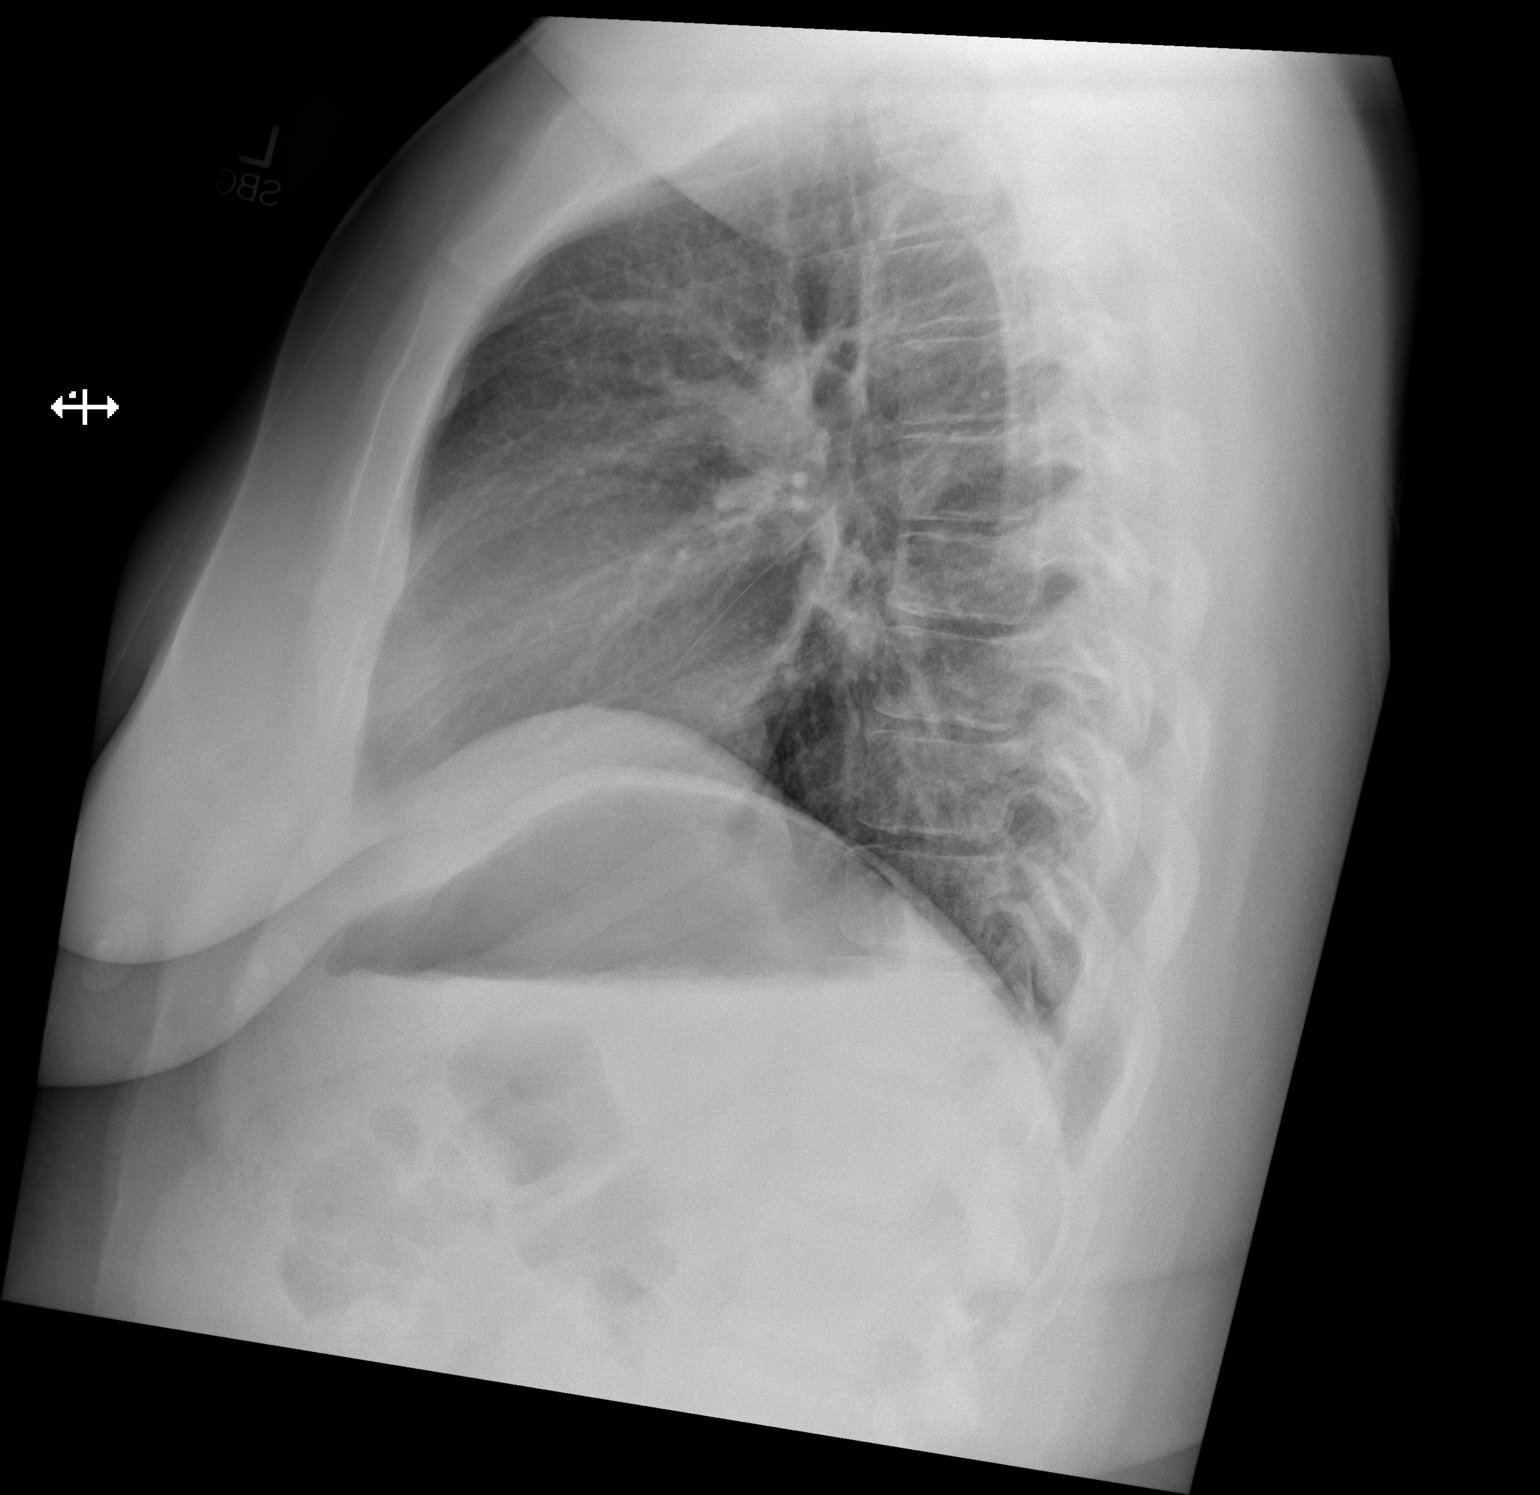

[w chest pa]
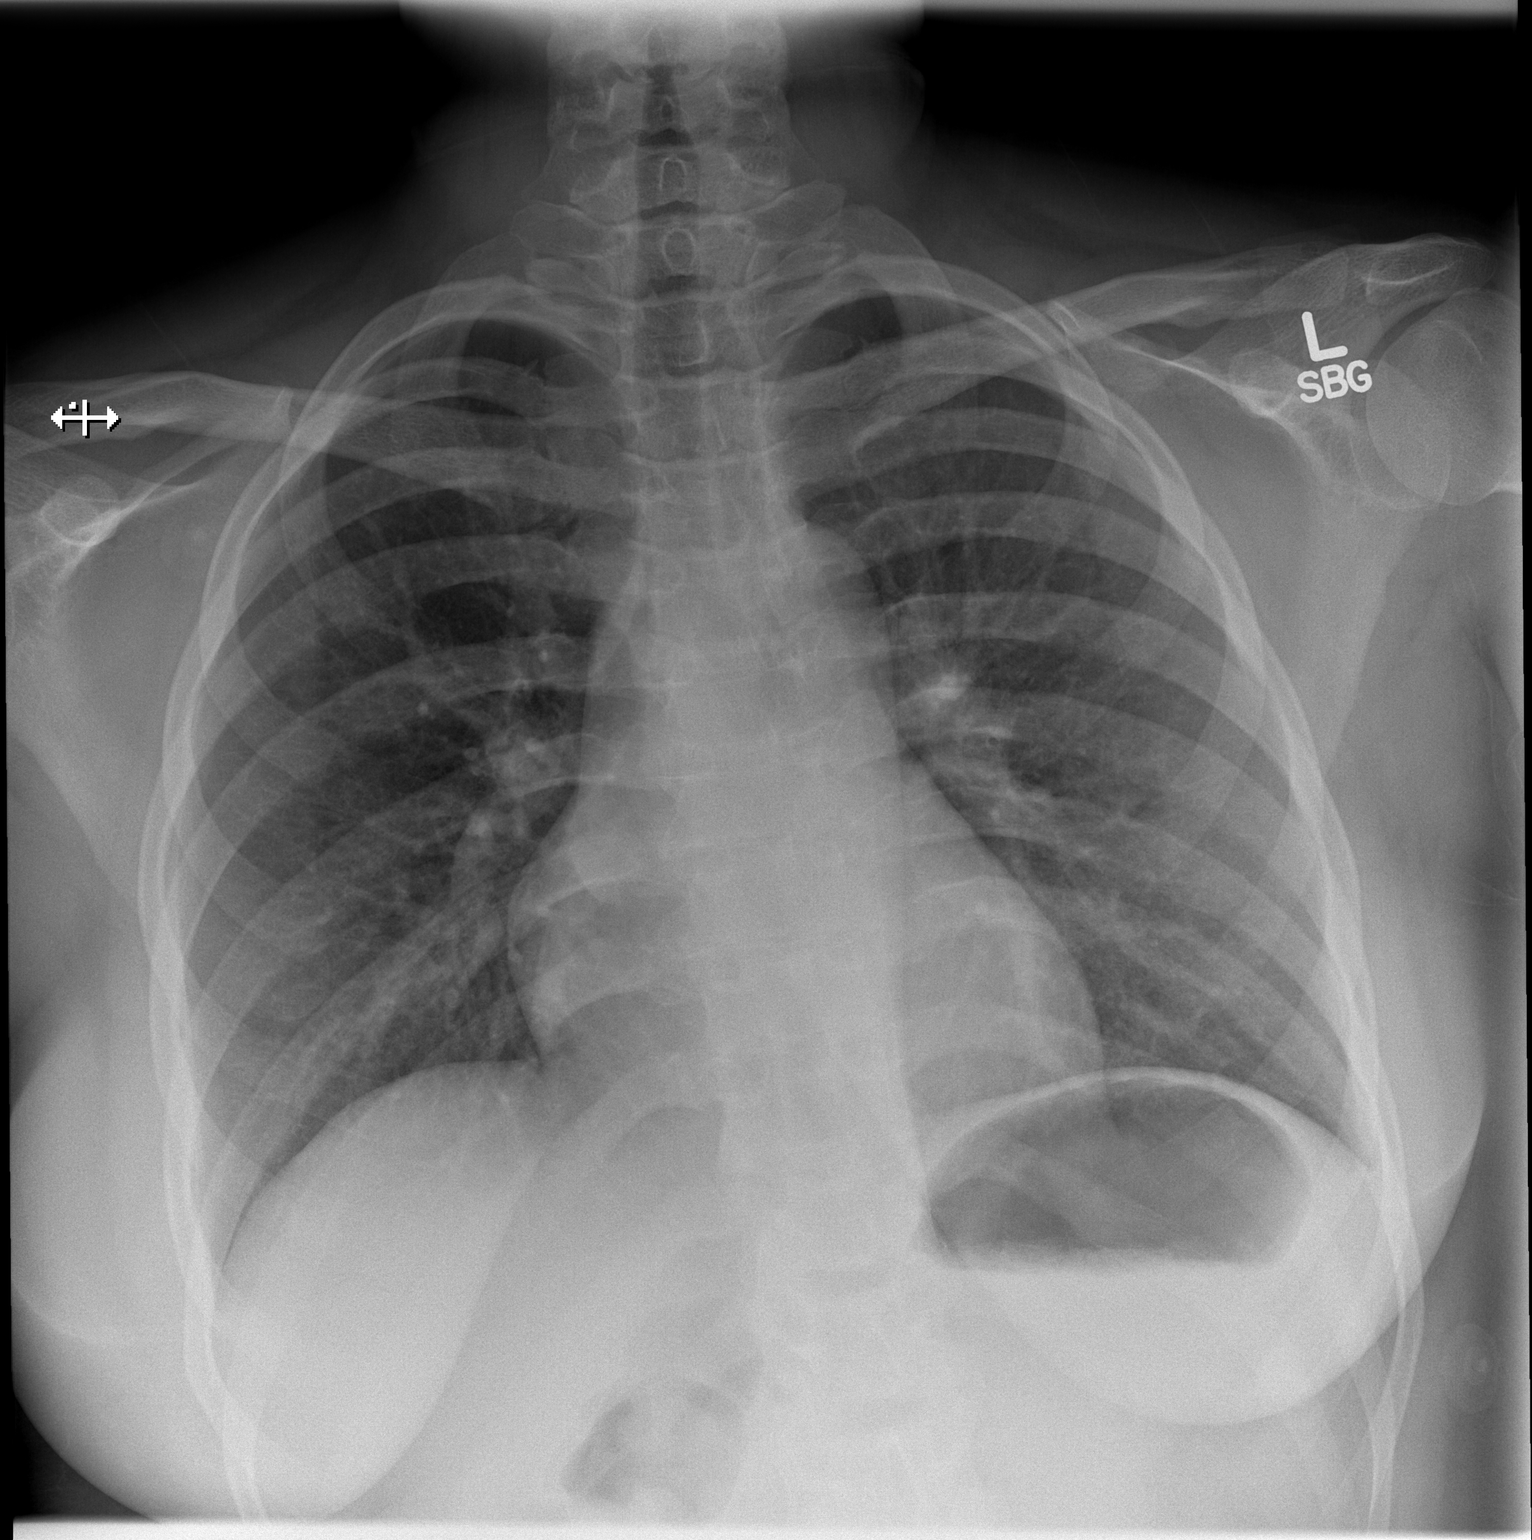

[2 of 2 positions shown; findings below may reference images not displayed]

FINDINGS: Lungs are adequately inflated without focal airspace consolidation
or effusion. Cardiomediastinal silhouette is within normal. Bones
and soft tissues are unremarkable.
IMPRESSION: No active cardiopulmonary disease.
